# Patient Record
Sex: Male | Born: 1960 | Race: Black or African American | Hispanic: No | Marital: Single | Smoking: Current every day smoker
Health system: Southern US, Community
[De-identification: ages and names within clinical notes are randomized; demographics above are authoritative.]

---

## 2009-12-19 ENCOUNTER — Emergency Department (HOSPITAL_COMMUNITY): Admission: EM | Admit: 2009-12-19 | Discharge: 2009-12-19 | Payer: Self-pay | Admitting: Family Medicine

## 2009-12-21 ENCOUNTER — Emergency Department (HOSPITAL_COMMUNITY): Admission: EM | Admit: 2009-12-21 | Discharge: 2009-12-21 | Payer: Self-pay | Admitting: Emergency Medicine

## 2010-12-01 LAB — POCT URINALYSIS DIP (DEVICE)
Bilirubin Urine: NEGATIVE
Glucose, UA: NEGATIVE mg/dL
Ketones, ur: NEGATIVE mg/dL
Nitrite: NEGATIVE
Protein, ur: NEGATIVE mg/dL
Specific Gravity, Urine: 1.025 (ref 1.005–1.030)
Urobilinogen, UA: 0.2 mg/dL (ref 0.0–1.0)

## 2011-02-04 IMAGING — CR DG LUMBAR SPINE COMPLETE 4+V
5 series · 5 of 5 positions shown · non-contrast
Comparison: None

CLINICAL DATA: Right back and leg pain.

LUMBAR SPINE - COMPLETE 4+ VIEW

[t l-spine a.p.]
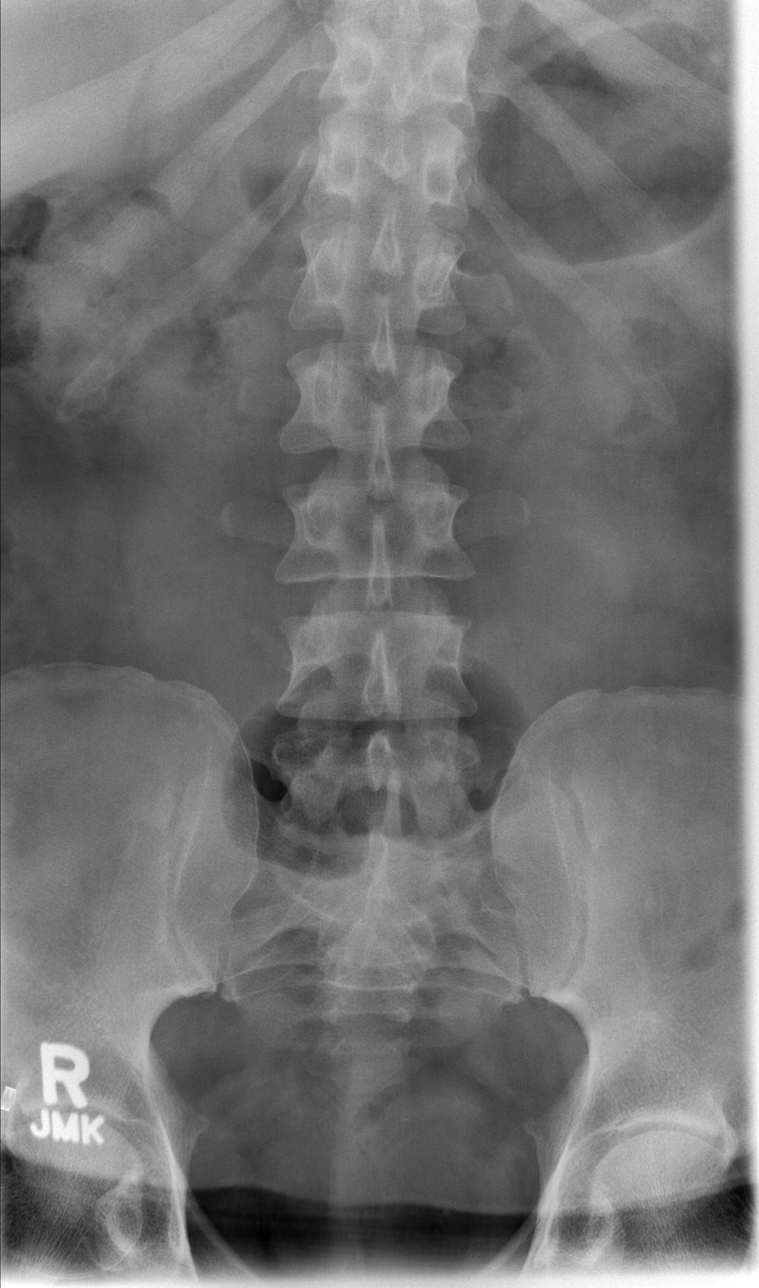

[t l-spine oblique exposure (1 of 2)]
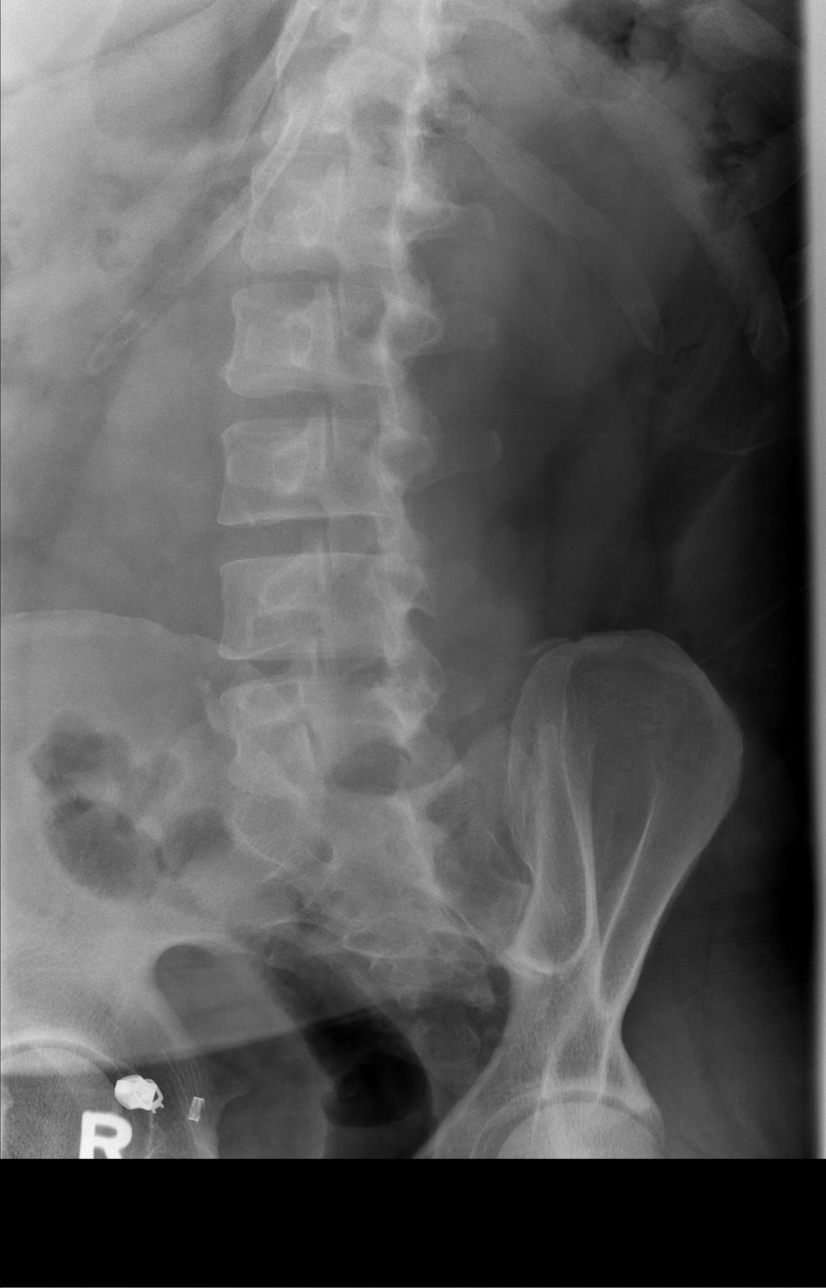

[t l-spine oblique exposure (2 of 2)]
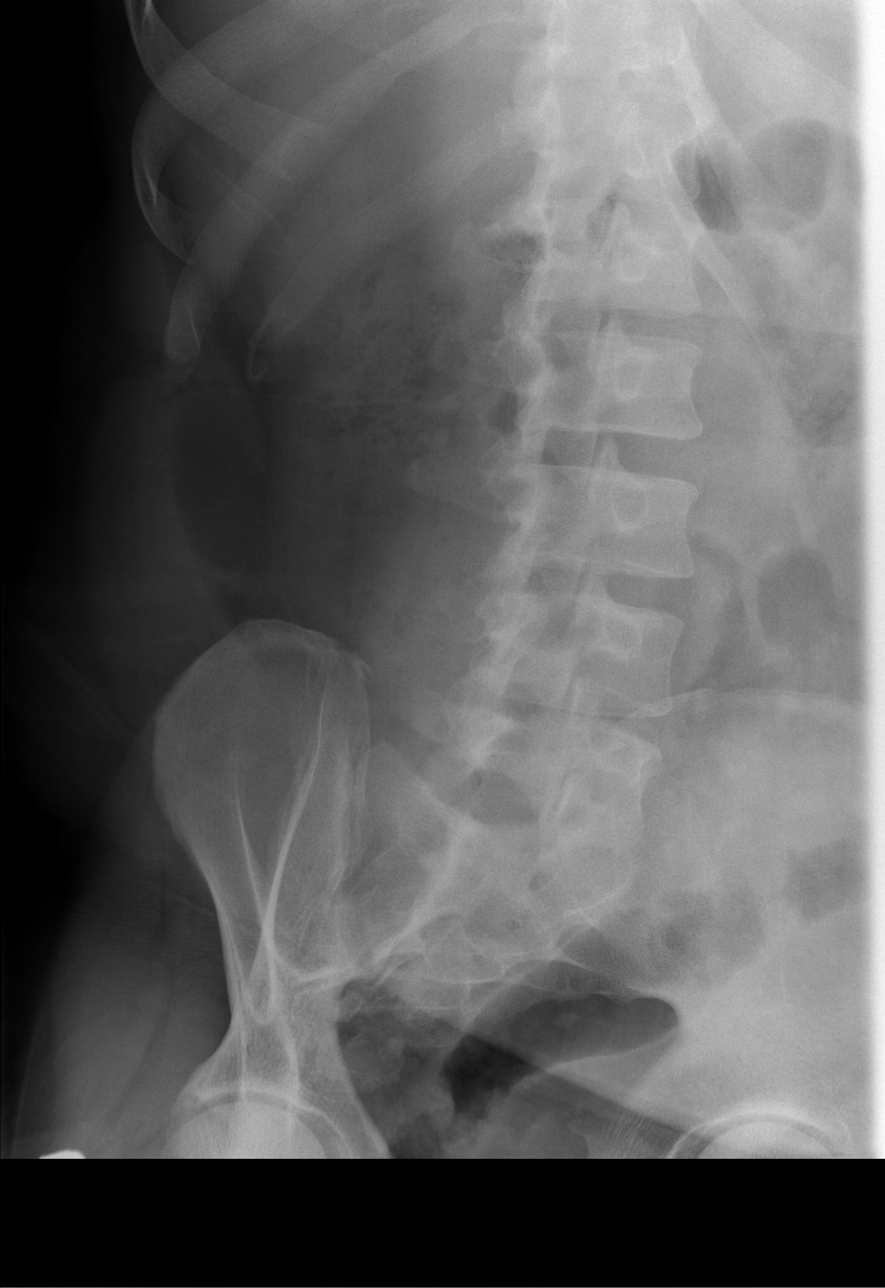

[t l-spine lat]
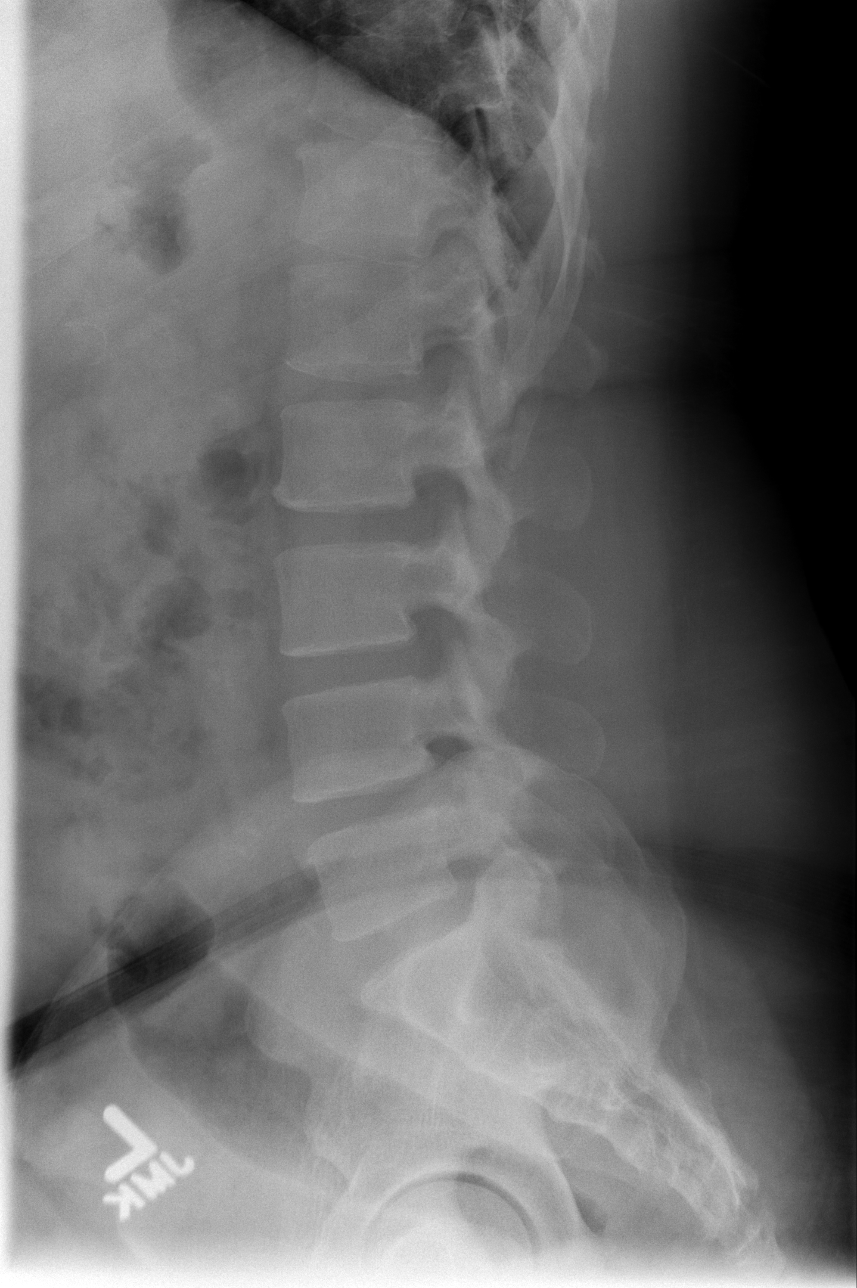

[t l-spine l5-s1 spot]
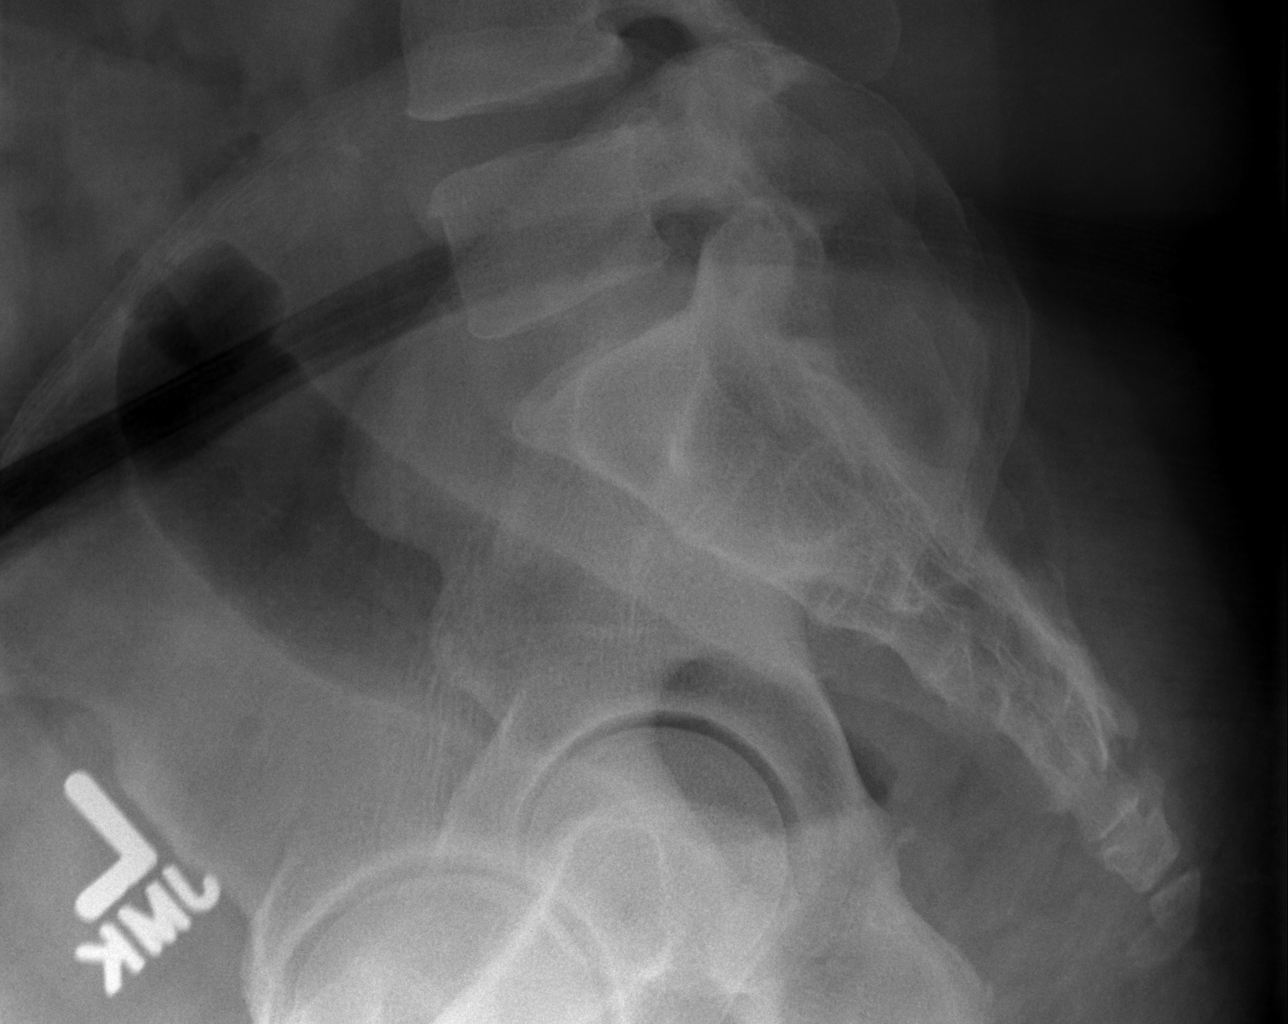

[5 of 5 positions shown; findings below may reference images not displayed]

FINDINGS: Alignment is normal.  No traumatic or degenerative
change.
IMPRESSION: Normal radiographs

## 2011-09-14 ENCOUNTER — Encounter: Payer: Self-pay | Admitting: *Deleted

## 2011-09-14 ENCOUNTER — Emergency Department (HOSPITAL_COMMUNITY)
Admission: EM | Admit: 2011-09-14 | Discharge: 2011-09-14 | Disposition: A | Discharge status: Home / Self Care | Payer: Self-pay | Attending: Emergency Medicine | Admitting: Emergency Medicine

## 2011-09-14 ENCOUNTER — Emergency Department (HOSPITAL_COMMUNITY): Payer: Self-pay

## 2011-09-14 DIAGNOSIS — M25562 Pain in left knee: Secondary | ICD-10-CM

## 2011-09-14 DIAGNOSIS — M25569 Pain in unspecified knee: Secondary | ICD-10-CM | POA: Insufficient documentation

## 2011-09-14 DIAGNOSIS — F17200 Nicotine dependence, unspecified, uncomplicated: Secondary | ICD-10-CM | POA: Insufficient documentation

## 2011-09-14 MED ORDER — PREDNISONE 50 MG PO TABS: 50.0000 mg | ORAL_TABLET | Freq: Every day | ORAL | Status: AC

## 2011-09-14 MED ORDER — NAPROXEN 500 MG PO TABS: 500.0000 mg | ORAL_TABLET | Freq: Two times a day (BID) | ORAL | Status: AC

## 2011-09-14 NOTE — ED Provider Notes (Signed)
History     CSN: [id number]  Arrival date & time 09/14/11  1213   First MD Initiated Contact with Patient 09/14/11 1338      Chief Complaint  Patient presents with  . Knee Pain    (Consider location/radiation/quality/duration/timing/severity/associated sxs/prior treatment) HPI... left knee pain since Friday. No known trauma.  Movement makes it worse. Symptoms are moderate. Sharp. No Radiation  History reviewed. No pertinent past medical history.  History reviewed. No pertinent past surgical history.  No family history on file.  History  Substance Use Topics  . Smoking status: Current Everyday Smoker  . Smokeless tobacco: Not on file  . Alcohol Use: Yes      Review of Systems  All other systems reviewed and are negative.    Allergies  Review of patient's allergies indicates no known allergies.  Home Medications   Current Outpatient Rx  Name Route Sig Dispense Refill  . ASPIRIN 325 MG PO TABS Oral Take 650 mg by mouth 4 (four) times daily as needed. For pain.     Marland Kitchen NAPROXEN 500 MG PO TABS Oral Take 1 tablet (500 mg total) by mouth 2 (two) times daily. 20 tablet 0  . PREDNISONE 50 MG PO TABS Oral Take 1 tablet (50 mg total) by mouth daily. 7 tablet 1    BP 126/80  Pulse 89  Temp(Src) 98.1 F (36.7 C) (Oral)  Resp 18  SpO2 99%  Physical Exam  Nursing note and vitals reviewed. Constitutional: He is oriented to person, place, and time. He appears well-developed and well-nourished.  Musculoskeletal:       Left knee: Generalized minimal tenderness.  Slight edema. Pain with flexion. Joint is not septic  Neurological: He is alert and oriented to person, place, and time.  Skin: Skin is warm and dry.  Psychiatric: He has a normal mood and affect.    ED Course  Procedures (including critical care time)  Labs Reviewed - No data to display Dg Knee Complete 4 Views Left  09/14/2011  *RADIOLOGY REPORT*  Clinical Data: Anterior left knee pain and swelling.  No known  injury.  LEFT KNEE - COMPLETE 4+ VIEW  Comparison: None.  Findings: Mild spur formation involving all three joint compartments.  No effusion or fracture seen.  IMPRESSION: Mild degenerative changes.  No acute abnormality.  Original Report Authenticated By: Darrol Angel, M.D.     1. Left knee pain       MDM  Uncertain etiology of knee pain. Suspect arthritic component. Will Rx prednisone and Naprosyn.        Donnetta Hutching, MD 09/14/11 [phone]

## 2011-09-14 NOTE — ED Notes (Signed)
Patient complains of pain in his left knee since Friday.  He states the pain runs up and down his leg.  He denies trauma. Patient reports his knee is swollen

## 2011-09-14 NOTE — ED Notes (Signed)
Ace wrap applied to left knee and pulse palpable distal to wrap

## 2011-09-14 NOTE — ED Notes (Signed)
Pt is here with left knee pain and swelling to upper patellar area.  Pulse present

## 2015-10-27 ENCOUNTER — Ambulatory Visit: Payer: Self-pay | Attending: Internal Medicine

## 2015-11-03 ENCOUNTER — Ambulatory Visit: Payer: Self-pay

## 2016-02-18 ENCOUNTER — Ambulatory Visit: Payer: Self-pay | Attending: Family Medicine | Admitting: Family Medicine

## 2016-02-18 ENCOUNTER — Encounter: Payer: Self-pay | Admitting: Family Medicine

## 2016-02-18 VITALS — BP 137/89 | HR 53 | Temp 98.0°F | Ht 71.0 in | Wt 289.8 lb

## 2016-02-18 DIAGNOSIS — Z13228 Encounter for screening for other metabolic disorders: Secondary | ICD-10-CM

## 2016-02-18 DIAGNOSIS — L84 Corns and callosities: Secondary | ICD-10-CM | POA: Insufficient documentation

## 2016-02-18 DIAGNOSIS — Z7982 Long term (current) use of aspirin: Secondary | ICD-10-CM | POA: Insufficient documentation

## 2016-02-18 DIAGNOSIS — M79671 Pain in right foot: Secondary | ICD-10-CM | POA: Insufficient documentation

## 2016-02-18 LAB — COMPLETE METABOLIC PANEL WITH GFR
ALT: 15 U/L (ref 9–46)
AST: 17 U/L (ref 10–35)
Albumin: 3.9 g/dL (ref 3.6–5.1)
Alkaline Phosphatase: 75 U/L (ref 40–115)
BUN: 11 mg/dL (ref 7–25)
CALCIUM: 9.2 mg/dL (ref 8.6–10.3)
CHLORIDE: 106 mmol/L (ref 98–110)
CO2: 23 mmol/L (ref 20–31)
CREATININE: 0.98 mg/dL (ref 0.70–1.33)
GFR, Est African American: >89 mL/min (ref >=60)
GFR, Est Non African American: 86 mL/min (ref >=60)
Glucose, Bld: 88 mg/dL (ref 65–99)
POTASSIUM: 4.4 mmol/L (ref 3.5–5.3)
Sodium: 138 mmol/L (ref 135–146)
Total Bilirubin: 0.7 mg/dL (ref 0.2–1.2)
Total Protein: 6.3 g/dL (ref 6.1–8.1)

## 2016-02-18 LAB — URIC ACID: URIC ACID, SERUM: 7.7 mg/dL (ref 4.0–8.0)

## 2016-02-18 MED ORDER — DICLOFENAC SODIUM 75 MG PO TBEC: 75.0000 mg | DELAYED_RELEASE_TABLET | Freq: Two times a day (BID) | ORAL | Status: AC

## 2016-02-18 NOTE — Progress Notes (Signed)
   Subjective:  Patient ID: Chad Wright, male    DOB: 11/16/1960  Age: 55 y.o. MRN: [id number]  CC: Hospitalization Follow-up   HPI Chad Wright presents With complaints of bilateral feet pain. Left foot hurts in the anterior aspect of the sole and right foot is throbbing as per patient. Symptoms have been on for the last few years and he denies history of trauma.  He denies history of swelling of the foot and doesn't have history of gout. Does not currently use any OTC medications. Pain is worse after his been on his feet for a long time and is worse at night and). 8/10.  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   No past medical history on file.   Outpatient Prescriptions Prior to Visit  Medication Sig Dispense Refill  . aspirin 325 MG tablet Take 650 mg by mouth 4 (four) times daily as needed. Reported on 02/18/2016     No facility-administered medications prior to visit.    ROS Review of Systems  Constitutional: Negative for activity change and appetite change.  HENT: Negative for sinus pressure and sore throat.   Respiratory: Negative for chest tightness, shortness of breath and wheezing.   Cardiovascular: Negative for chest pain and palpitations.  Gastrointestinal: Negative for abdominal pain, constipation and abdominal distention.  Genitourinary: Negative.   Musculoskeletal:       See hpi  Psychiatric/Behavioral: Negative for behavioral problems and dysphoric mood.    Objective:  BP 137/89 mmHg  Pulse 53  Temp(Src) 98 F (36.7 C) (Oral)  Wt 289 lb 12.8 oz (131.452 kg)  BP/Weight 02/18/2016 09/14/2011  Systolic BP 137 122  Diastolic BP 89 86  Wt. (Lbs) 289.8 -      Physical Exam  Constitutional: He is oriented to person, place, and time. He appears well-developed and well-nourished.    Cardiovascular: Normal rate, normal heart sounds and intact distal pulses.   No murmur heard. Pulmonary/Chest: Effort normal and breath sounds normal. He has no wheezes. He has no rales. He exhibits no tenderness.  Abdominal: Soft. Bowel sounds are normal. He exhibits no distension and no mass. There is no tenderness.  Musculoskeletal: He exhibits tenderness (Normal appearance of foot ; tenderness to palpation of right heel; no tenderness on range of motion of ankles).  Left foot with tender callus in anterior aspect of sole.  Left hand middle finger with healing laceration  Neurological: He is alert and oriented to person, place, and time.     Assessment & Plan:   1. Callus of foot - Ambulatory referral to Podiatry  2. Right foot pain Likely plantar fasciitis - Uric Acid - diclofenac (VOLTAREN) 75 MG EC tablet; Take 1 tablet (75 mg total) by mouth 2 (two) times daily.  Dispense: 60 tablet; Refill: 1  3. Screening for metabolic disorder - COMPLETE METABOLIC PANEL WITH GFR   Meds ordered this encounter  Medications  . diclofenac (VOLTAREN) 75 MG EC tablet    Sig: Take 1 tablet (75 mg total) by mouth 2 (two) times daily.    Dispense:  60 tablet    Refill:  1    Follow-up: Return in about 1 month (around 03/19/2016) for Follow-up of right foot pain; place on podiatrist schedule.Jaclyn Shaggy.   Kuuipo Anzaldo Amao MD

## 2016-02-19 ENCOUNTER — Telehealth: Payer: Self-pay

## 2016-02-19 NOTE — Telephone Encounter (Signed)
-----   Message from Jaclyn ShaggyEnobong Amao, MD sent at 02/19/2016  8:25 AM EDT ----- Please inform the patient that labs are normal. Thank you.

## 2016-02-19 NOTE — Telephone Encounter (Signed)
Writer called patient to report that lab results were back and WNL.  Patient stated understanding.

## 2016-04-18 ENCOUNTER — Ambulatory Visit: Payer: Self-pay | Attending: Family Medicine | Admitting: Podiatry

## 2016-04-18 DIAGNOSIS — M79671 Pain in right foot: Secondary | ICD-10-CM

## 2016-04-18 DIAGNOSIS — Q828 Other specified congenital malformations of skin: Secondary | ICD-10-CM

## 2016-04-18 DIAGNOSIS — M722 Plantar fascial fibromatosis: Secondary | ICD-10-CM

## 2016-04-18 NOTE — Progress Notes (Signed)
Subjective:     Patient ID: Chad Wright, male   DOB: 04/30/1961, 55 y.o.   MRN: [id number]  HPI 55 year old male presents to the office today for concerns of right foot pain and a "wart" on the left foot. He states the "wart" is painful with pressure during work. No redness, drainage, swelling. Right foot is painful when standing all day and ha been ongoing for several months. No recent injury or trauma. He takes ibuprofen 800mg  for this. No other complaints.   Review of Systems  All other systems reviewed and are negative.      Objective:   Physical Exam General: AAO x3, NAD  Dermatological: Left submet 3 hyperkerotic lesion. No underlying ulceration or evidence of verruca today. No other open lesions or pre-ulcerative lesions.   Vascular: Dorsalis Pedis artery and Posterior Tibial artery pedal pulses are 2/4 bilateral with immedate capillary fill time. Pedal hair growth present.There is no pain with calf compression, swelling, warmth, erythema.   Neruologic: Sensation intact.   Musculoskeletal: Tenderness to palpation along the plantar medial tubercle of the calcaneus at the insertion of plantar fascia on the right foot. There is no pain along the course of the plantar fascia within the arch of the foot. Plantar fascia appears to be intact. There is no pain with lateral compression of the calcaneus or pain with vibratory sensation. There is no pain along the course or insertion of the achilles tendon. Mild tenderness along the dorsal midfoot. No other areas of tenderness to bilateral lower extremities.  Gait: Unassisted, Nonantalgic.      Assessment:     Right heel pain, plantar fasciitis; midfoot pain; porokeratosis     Plan:     -Treatment options discussed including all alternatives, risks, and complications -Hyperkerotic lesion debrided on left. Salinocaine applied followed by bandage. Post-procedure instructions discussed. Monitor for infection.  -Stretching and icing right foot  daily. X-ray ordered. Has insert and recommended to wear it. Shoe changes.   Ovid CurdMatthew Wagoner, dPM

## 2016-04-21 NOTE — Addendum Note (Signed)
Addended by: Ovid CurdWAGONER, Federica [place]port R on: 04/21/2016 05:08 PM   Modules accepted: Orders

## 2016-04-25 ENCOUNTER — Ambulatory Visit (HOSPITAL_COMMUNITY)
Admission: RE | Admit: 2016-04-25 | Discharge: 2016-04-25 | Disposition: A | Discharge status: Home / Self Care | Payer: Self-pay | Source: Ambulatory Visit | Attending: Podiatry | Admitting: Podiatry

## 2016-04-25 DIAGNOSIS — M7731 Calcaneal spur, right foot: Secondary | ICD-10-CM | POA: Insufficient documentation

## 2016-04-25 DIAGNOSIS — M79671 Pain in right foot: Secondary | ICD-10-CM | POA: Insufficient documentation

## 2016-04-25 DIAGNOSIS — M722 Plantar fascial fibromatosis: Secondary | ICD-10-CM | POA: Insufficient documentation

## 2017-07-31 ENCOUNTER — Encounter (HOSPITAL_COMMUNITY): Payer: Self-pay

## 2017-07-31 ENCOUNTER — Other Ambulatory Visit: Payer: Self-pay

## 2017-07-31 ENCOUNTER — Emergency Department (HOSPITAL_COMMUNITY)
Admission: EM | Admit: 2017-07-31 | Discharge: 2017-07-31 | Disposition: A | Discharge status: Home / Self Care | Payer: Self-pay | Attending: Emergency Medicine | Admitting: Emergency Medicine

## 2017-07-31 DIAGNOSIS — I159 Secondary hypertension, unspecified: Secondary | ICD-10-CM

## 2017-07-31 DIAGNOSIS — F17200 Nicotine dependence, unspecified, uncomplicated: Secondary | ICD-10-CM | POA: Insufficient documentation

## 2017-07-31 DIAGNOSIS — R030 Elevated blood-pressure reading, without diagnosis of hypertension: Secondary | ICD-10-CM | POA: Insufficient documentation

## 2017-07-31 LAB — CBC WITH DIFFERENTIAL/PLATELET
BASOS ABS: 0 10*3/uL (ref 0.0–0.1)
Basophils Relative: 0 %
EOS ABS: 0.3 10*3/uL (ref 0.0–0.7)
Eosinophils Relative: 3 %
HCT: 45.4 % (ref 39.0–52.0)
HEMOGLOBIN: 15.3 g/dL (ref 13.0–17.0)
LYMPHS ABS: 2.6 10*3/uL (ref 0.7–4.0)
Lymphocytes Relative: 29 %
MCH: 31.4 pg (ref 26.0–34.0)
MCHC: 33.7 g/dL (ref 30.0–36.0)
MCV: 93.2 fL (ref 78.0–100.0)
Monocytes Absolute: 0.5 10*3/uL (ref 0.1–1.0)
Monocytes Relative: 5 %
NEUTROS PCT: 63 %
Neutro Abs: 5.5 10*3/uL (ref 1.7–7.7)
PLATELETS: 278 10*3/uL (ref 150–400)
RBC: 4.87 MIL/uL (ref 4.22–5.81)
RDW: 14.1 % (ref 11.5–15.5)
WBC: 8.9 10*3/uL (ref 4.0–10.5)

## 2017-07-31 LAB — BASIC METABOLIC PANEL
ANION GAP: 7 (ref 5–15)
BUN: 8 mg/dL (ref 6–20)
CHLORIDE: 106 mmol/L (ref 101–111)
CO2: 25 mmol/L (ref 22–32)
Calcium: 9.4 mg/dL (ref 8.9–10.3)
Creatinine, Ser: 1.1 mg/dL (ref 0.61–1.24)
Glucose, Bld: 96 mg/dL (ref 65–99)
POTASSIUM: 3.9 mmol/L (ref 3.5–5.1)
SODIUM: 138 mmol/L (ref 135–145)

## 2017-07-31 NOTE — ED Triage Notes (Signed)
Patient reports that a [employer] nurse checked his BP yesterday while he was at work and was told to come to ED because very high, reports headache today on way to work. On assessment alert and oriented, no neuro deficits

## 2017-07-31 NOTE — ED Notes (Signed)
Pt called for room. No answer 

## 2017-07-31 NOTE — ED Provider Notes (Signed)
[facility] Provider Note   CSN: [id number] Arrival date & time: 07/31/17  1110     History   Chief Complaint No chief complaint on file.   HPI Chad Wright is a 56 y.o. male.  HPI 56 year old male with no pertinent past medical history presenting with an episode of hypertension yesterday, lightheadedness, blurry vision and a mild headache.  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  This morning while driving to work he had an episode of lightheadedness, headache and blurry vision.  It lasted approximately 15 minutes and resolved.  He denies the symptoms currently.  Denies chest pain, shortness of breath, abdominal pain, nausea, vomiting, numbness, weakness.  Has not been diagnosed with hypertension in the past.  History reviewed. No pertinent past medical history.  Patient Active Problem List   Diagnosis Date Noted  . Callus of foot 02/18/2016  . Right foot pain 02/18/2016    History reviewed. No pertinent surgical history.     Home Medications    Prior to Admission medications   Medication Sig Start Date End Date Taking? Authorizing Provider  aspirin 325 MG tablet Take 650 mg by mouth 4 (four) times daily as needed. Reported on 02/18/2016   Yes [provider]  ibuprofen (ADVIL,MOTRIN) 200 MG tablet Take 800 mg every 6 (six) hours as needed by mouth.   Yes [provider]  diclofenac (VOLTAREN) 75 MG EC tablet Take 1 tablet (75 mg total) by mouth 2 (two) times daily. Patient not taking: Reported on 07/31/2017 02/18/16   Jaclyn ShaggyAmao, Enobong, MD    Family History No family history on file.  Social History Social History   Tobacco Use  . Smoking status: Current Every Day Smoker  . Smokeless tobacco: Never Used  Substance Use Topics  . Alcohol use: Yes  . Drug use: No     Allergies   Patient has no known allergies.   Review of Systems Review of  Systems  Constitutional: Negative for chills and fever.  HENT: Negative for ear pain and sore throat.   Eyes: Negative for pain and visual disturbance.  Respiratory: Negative for cough and shortness of breath.   Cardiovascular: Negative for chest pain and palpitations.  Gastrointestinal: Negative for abdominal pain and vomiting.  Genitourinary: Negative for dysuria and hematuria.  Musculoskeletal: Negative for arthralgias and back pain.  Skin: Negative for color change and rash.  Neurological: Positive for light-headedness. Negative for dizziness, seizures, syncope, weakness and numbness.  All other systems reviewed and are negative.    Physical Exam Updated Vital Signs BP (!) 143/88   Pulse 76   Temp 98.7 F (37.1 C) (Oral)   Resp 19   SpO2 97%   Physical Exam  Constitutional: He is oriented to person, place, and time. He appears well-developed and well-nourished.  HENT:  Head: Normocephalic and atraumatic.  Eyes: Conjunctivae are normal.  Neck: Neck supple.  Cardiovascular: Normal rate, regular rhythm, S1 normal, S2 normal, normal heart sounds, intact distal pulses and normal pulses. Exam reveals no gallop and no friction rub.  No murmur heard. Pulmonary/Chest: Effort normal and breath sounds normal. No respiratory distress.  Abdominal: Soft. He exhibits no distension. There is no tenderness.  Musculoskeletal: He exhibits no edema.  Neurological: He is alert and oriented to person, place, and time. He has normal reflexes. He displays a negative Romberg sign. GCS eye subscore is 4. GCS verbal  subscore is 5. GCS motor subscore is 6.  CN 2-12 grossly intact. 5/5 strength in all four extremities. Normal coordination on finger to nose and rapid alternating movements. Normal gait.   Skin: Skin is warm and dry.  Psychiatric: He has a normal mood and affect.  Nursing note and vitals reviewed.    ED Treatments / Results  Labs (all labs ordered are listed, but only abnormal  results are displayed) Labs Reviewed  CBC WITH DIFFERENTIAL/PLATELET  BASIC METABOLIC PANEL    EKG  EKG Interpretation  Date/Time:  Monday July 31 2017 19:50:24 EST Ventricular Rate:  72 PR Interval:    QRS Duration: 86 QT Interval:  377 QTC Calculation: 413 R Axis:     Text Interpretation:  No STEMI.  Confirmed by Alona BeneLong, Joshua [phone]([id number]) on 07/31/2017 8:14:35 PM       Radiology No results found.  Procedures Procedures (including critical care time)  Medications Ordered in ED Medications - No data to display   Initial Impression / Assessment and Plan / ED Course  I have reviewed the triage vital signs and the nursing notes.  Pertinent labs & imaging results that were available during my care of the patient were reviewed by me and considered in my medical decision making (see chart for details).     56 year old male with no past medical history presenting with hypertension, lightheadedness, blurry vision.  Not experience any symptoms at this time.  Blood pressure in triage 120s over 90s and on my assessment is 140s over 90s.  Exam is as above.  EKG with no ischemic changes no signs of dysrhythmia. Labs unremarkable with no signs of end organ damage. Normal neurologic exam, no neuroimaging indicated at this time. As his BP is controlled at this time and is asymptomatic, will d/c home with return precautions and PCP follow up for re-evaluation of hypertension. Pt amenable with this plan.  Final Clinical Impressions(s) / ED Diagnoses   Final diagnoses:  Secondary hypertension    ED Discharge Orders    None       Eshawn Coor Italyhad, MD 07/31/17 2031    Maia PlanLong, Joshua G, MD 08/01/17 0111

## 2017-07-31 NOTE — ED Notes (Signed)
Patient ready to go home.Discussed using diet to control HTN. 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

## 2017-07-31 NOTE — Discharge Instructions (Signed)
Please return for severe headache, vision changes, vomiting, abdominal or chest pain, or shortness of breath. Please follow up with your PCP for re-evaluation of your hypertension as you may need to be started on blood pressure medication.
